# Patient Record
Sex: Male | Born: 1980 | Race: Black or African American | Hispanic: No | Marital: Single
Health system: Southern US, Community
[De-identification: ages and names within clinical notes are randomized; demographics above are authoritative.]

---

## 2006-08-29 ENCOUNTER — Emergency Department (HOSPITAL_COMMUNITY): Admission: EM | Admit: 2006-08-29 | Discharge: 2006-08-29 | Payer: Self-pay | Admitting: Emergency Medicine

## 2021-04-05 ENCOUNTER — Emergency Department: Payer: PRIVATE HEALTH INSURANCE

## 2021-04-05 ENCOUNTER — Emergency Department
Admission: EM | Admit: 2021-04-05 | Discharge: 2021-04-05 | Disposition: A | Discharge status: Other Acute Inpatient Hospital | Payer: PRIVATE HEALTH INSURANCE | Attending: Emergency Medicine | Admitting: Emergency Medicine

## 2021-04-05 DIAGNOSIS — Z20822 Contact with and (suspected) exposure to covid-19: Secondary | ICD-10-CM | POA: Insufficient documentation

## 2021-04-05 DIAGNOSIS — W1789XA Other fall from one level to another, initial encounter: Secondary | ICD-10-CM | POA: Insufficient documentation

## 2021-04-05 DIAGNOSIS — S82872B Displaced pilon fracture of left tibia, initial encounter for open fracture type I or II: Secondary | ICD-10-CM | POA: Insufficient documentation

## 2021-04-05 DIAGNOSIS — S82302A Unspecified fracture of lower end of left tibia, initial encounter for closed fracture: Secondary | ICD-10-CM

## 2021-04-05 DIAGNOSIS — Y9289 Other specified places as the place of occurrence of the external cause: Secondary | ICD-10-CM | POA: Insufficient documentation

## 2021-04-05 DIAGNOSIS — S0990XA Unspecified injury of head, initial encounter: Secondary | ICD-10-CM | POA: Insufficient documentation

## 2021-04-05 DIAGNOSIS — Y9339 Activity, other involving climbing, rappelling and jumping off: Secondary | ICD-10-CM | POA: Insufficient documentation

## 2021-04-05 DIAGNOSIS — M25572 Pain in left ankle and joints of left foot: Secondary | ICD-10-CM | POA: Insufficient documentation

## 2021-04-05 DIAGNOSIS — S99912A Unspecified injury of left ankle, initial encounter: Secondary | ICD-10-CM | POA: Diagnosis present

## 2021-04-05 LAB — CBC WITH DIFFERENTIAL/PLATELET
Abs Immature Granulocytes: 0.02 10*3/uL (ref 0.00–0.07)
Basophils Absolute: 0 10*3/uL (ref 0.0–0.1)
Basophils Relative: 0 %
Eosinophils Absolute: 0 10*3/uL (ref 0.0–0.5)
Eosinophils Relative: 0 %
HCT: 40.9 % (ref 39.0–52.0)
Hemoglobin: 14.5 g/dL (ref 13.0–17.0)
Immature Granulocytes: 0 %
Lymphocytes Relative: 25 %
Lymphs Abs: 2.6 10*3/uL (ref 0.7–4.0)
MCH: 30.9 pg (ref 26.0–34.0)
MCHC: 35.5 g/dL (ref 30.0–36.0)
MCV: 87 fL (ref 80.0–100.0)
Monocytes Absolute: 0.7 10*3/uL (ref 0.1–1.0)
Monocytes Relative: 7 %
Neutro Abs: 6.9 10*3/uL (ref 1.7–7.7)
Neutrophils Relative %: 68 %
Platelets: 269 10*3/uL (ref 150–400)
RBC: 4.7 MIL/uL (ref 4.22–5.81)
RDW: 11.6 % (ref 11.5–15.5)
WBC: 10.3 10*3/uL (ref 4.0–10.5)
nRBC: 0 % (ref 0.0–0.2)

## 2021-04-05 LAB — BASIC METABOLIC PANEL
Anion gap: 11 (ref 5–15)
BUN: 13 mg/dL (ref 6–20)
CO2: 23 mmol/L (ref 22–32)
Calcium: 9.4 mg/dL (ref 8.9–10.3)
Chloride: 103 mmol/L (ref 98–111)
Creatinine, Ser: 0.95 mg/dL (ref 0.61–1.24)
GFR, Estimated: >60 mL/min (ref >60)
Glucose, Bld: 107 mg/dL — ABNORMAL HIGH (ref 70–99)
Potassium: 3.4 mmol/L — ABNORMAL LOW (ref 3.5–5.1)
Sodium: 137 mmol/L (ref 135–145)

## 2021-04-05 LAB — RESP PANEL BY RT-PCR (FLU A&B, COVID) ARPGX2
Influenza A by PCR: NEGATIVE
Influenza B by PCR: NEGATIVE
SARS Coronavirus 2 by RT PCR: NEGATIVE

## 2021-04-05 MED ORDER — ONDANSETRON HCL 4 MG/2ML IJ SOLN
4.0000 mg | Freq: Once | INTRAMUSCULAR | Status: AC
  Administered 2021-04-05: 4 mg via INTRAVENOUS
  Filled 2021-04-05: qty 2

## 2021-04-05 MED ORDER — FENTANYL CITRATE (PF) 100 MCG/2ML IJ SOLN
50.0000 ug | INTRAMUSCULAR | Status: DC | PRN
  Administered 2021-04-05: 50 ug via INTRAMUSCULAR
  Filled 2021-04-05: qty 2

## 2021-04-05 MED ORDER — FENTANYL CITRATE (PF) 100 MCG/2ML IJ SOLN
50.0000 ug | INTRAMUSCULAR | Status: AC | PRN
  Administered 2021-04-05: 50 ug via INTRAVENOUS

## 2021-04-05 MED ORDER — CEFAZOLIN SODIUM-DEXTROSE 2-4 GM/100ML-% IV SOLN
2.0000 g | Freq: Once | INTRAVENOUS | Status: AC
  Administered 2021-04-05: 2 g via INTRAVENOUS
  Filled 2021-04-05: qty 100

## 2021-04-05 NOTE — ED Triage Notes (Addendum)
Pt presents in the custody of [facility].  C/o L ankle pain and laceration.  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  Pt reports landing directly on L ankle/foot.  Pain score 10/10.    Deformity noted above ankle.  Moderate bleeding noted.  Pressure dressing applied.

## 2021-04-05 NOTE — ED Notes (Signed)
Patient transported to CT 

## 2021-04-05 NOTE — ED Notes (Signed)
Pt BIB [employer] for fall from approximately 12 feet onto left foot. Pt a/ox4, denies head/neck or back pain or LOC. Obvious deformity to ankle. Possible open FX with blood noted. +distal functions.

## 2021-04-05 NOTE — ED Provider Notes (Addendum)
[facility] Emergency Department Provider Note ____________________________________________  Time seen: 1400  I have reviewed the triage vital signs and the nursing notes.  HISTORY  Chief Complaint  Ankle Pain and Extremity Laceration   HPI Stephen Yang is a 40 y.o. male presents to the ED urgently, and custody of [employer].  Patient was an inmate at the jail, when he apparently fell from a height of about 12 feet landing on his feet.  He was apparently climbing on an air duct, when he fell landing primarily his left foot and ankle.He presents with immediate pain, deformity, and disability to the left distal ankle.  He denies any other injury including head injury or LOC.  Scheduled to be released from jail on tomorrow.  History reviewed. No pertinent past medical history.  There are no problems to display for this patient.   History reviewed. No pertinent surgical history.  Prior to Admission medications   Not on File    Allergies Patient has no known allergies.  History reviewed. No pertinent family history.  Social History    Review of Systems  Constitutional: Negative for fever. Eyes: Negative for visual changes. ENT: Negative for sore throat. Cardiovascular: Negative for chest pain. Respiratory: Negative for shortness of breath. Gastrointestinal: Negative for abdominal pain, vomiting and diarrhea. Genitourinary: Negative for dysuria. Musculoskeletal: Negative for back pain. Left ankle deformity  Skin: Negative for rash. Neurological: Negative for headaches, focal weakness or numbness. ____________________________________________  PHYSICAL EXAM:  VITAL SIGNS: ED Triage Vitals  Enc Vitals Group     BP 04/05/21 1340 140/86     Pulse Rate 04/05/21 1340 75     Resp 04/05/21 1340 (!) 22     Temp 04/05/21 1340 98.6 F (37 C)     Temp Source 04/05/21 1340 Oral     SpO2 04/05/21 1340 100 %     Weight 04/05/21 1341  165 lb (74.8 kg)     Height 04/05/21 1341 5\' 10"  (1.778 m)     Head Circumference --      Peak Flow --      Pain Score 04/05/21 1341 10     Pain Loc --      Pain Edu? --      Excl. in GC? --     Constitutional: Alert and oriented. Well appearing and in no distress. GCS = 15 Head: Normocephalic and atraumatic. Eyes: Conjunctivae are normal. Normal extraocular movements Mouth/Throat: Mucous membranes are moist. Neck: Supple. Normal ROM. No crepitus of midline tenderness Hematological/Lymphatic/Immunological: No cervical lymphadenopathy. Cardiovascular: Normal rate, regular rhythm. Normal distal pulses and cap refill. Respiratory: Normal respiratory effort. No wheezes/rales/rhonchi. Gastrointestinal: Soft and nontender. No distention. Musculoskeletal: Normal spinal alignment without midline tenderness, spasm, vomiting, or step-off.  Left ankle with obvious deformity appreciated.  Patient with a 3 mm lateral malleoli skin puncture wound with active bleeding noted.  Nontender with normal range of motion in all other extremities.  Neurologic:  Normal gross sensation. Normal speech and language. No gross focal neurologic deficits are appreciated. Skin:  Skin is warm, dry and intact. No rash noted. Psychiatric: Mood and affect are normal. Patient exhibits appropriate insight and judgment. ____________________________________________    {LABS (pertinent positives/negatives) Labs Reviewed  BASIC METABOLIC PANEL - Abnormal; Notable for the following components:      Result Value   Potassium 3.4 (*)    Glucose, Bld 107 (*)    All other components within normal limits  RESP PANEL BY RT-PCR (FLU A&B,  COVID) ARPGX2  CBC WITH DIFFERENTIAL/PLATELET  ____________________________________________  {EKG  ____________________________________________   RADIOLOGY Official radiology report(s): DG Tibia/Fibula Left  Result Date: 04/05/2021 CLINICAL DATA:  Distal tib fib fracture.  Status post  reduction. EXAM: LEFT TIBIA AND FIBULA - 2 VIEW COMPARISON:  April 05, 2021 FINDINGS: The patient has been placed in a cast in the interval. The displaced comminuted distal tibial fracture has been partially reduced. The distal fibular fracture has been partially reduced. No other acute abnormalities. IMPRESSION: The patient has been placed in a cast. The displaced comminuted distal tibial fracture has been partially reduced as has the displaced distal fibular fracture. Malalignment remains however. Electronically Signed   By: Gerome Sam III M.D.   On: 04/05/2021 15:58   DG Ankle Complete Left  Result Date: 04/05/2021 CLINICAL DATA:  Fall, pain. EXAM: LEFT ANKLE COMPLETE - 3+ VIEW COMPARISON:  None. FINDINGS: Markedly displaced/comminuted fractures of the distal tibia and fibula, with extension to the articular surface of the distal tibia, involvement of the medial malleolus and probable involvement of the posterior malleolus. Proximal fibular fracture fragment approximates the skin surface but there is no obvious evidence of open fracture. Associated asymmetry of the ankle mortise. Talar dome appears grossly intact. Visualized osseous structures of the hindfoot appear grossly intact and normally aligned. IMPRESSION: 1. Markedly displaced/comminuted fractures of the distal tibia and fibula, likely trimalleolar, and with fracture extension to the articular surface of the distal tibia. 2. No convincing evidence of open fracture, although the proximal fibular fracture fragment approximates the skin surface and overlying bandages. Electronically Signed   By: Bary Richard M.D.   On: 04/05/2021 14:41   CT HEAD WO CONTRAST ( )  Result Date: 04/05/2021 CLINICAL DATA:  Polytrauma, critical, head/C-spine injury suspected. Fall from 12' height. EXAM: CT HEAD WITHOUT CONTRAST CT CERVICAL SPINE WITHOUT CONTRAST TECHNIQUE: Multidetector CT imaging of the head and cervical spine was performed following the  standard protocol without intravenous contrast. Multiplanar CT image reconstructions of the cervical spine were also generated. COMPARISON:  None. FINDINGS: CT HEAD FINDINGS Brain: There is no evidence of an acute infarct, intracranial hemorrhage, mass, midline shift, or extra-axial fluid collection. The ventricles and sulci are normal. Vascular: No hyperdense vessel. Skull: No fracture or suspicious osseous lesion. Sinuses/Orbits: Visualized paranasal sinuses and mastoid air cells are clear. Unremarkable orbits. Other: None. CT CERVICAL SPINE FINDINGS Alignment: Straightening of the normal cervical lordosis. Trace retrolisthesis of C5 on C6, degenerative in appearance. Skull base and vertebrae: No acute fracture or suspicious osseous lesion. Soft tissues and spinal canal: No prevertebral fluid or swelling. No visible canal hematoma. Disc levels: Focally advanced disc degeneration at C5-6 with moderate to severe disc space narrowing and prominent degenerative endplate sclerosis. A broad-based posterior disc osteophyte complex at C5-6 including asymmetric right uncovertebral spurring results in suspected moderate spinal stenosis and moderate right and mild left neural foraminal stenosis. Upper chest: Clear lung apices. Other: None. IMPRESSION: 1. Negative head CT. 2. No acute cervical spine fracture. 3. Focally advanced disc degeneration at C5-6 with suspected moderate spinal stenosis and moderate right neural foraminal stenosis. Electronically Signed   By: Sebastian Ache M.D.   On: 04/05/2021 16:02   CT Cervical Spine Wo Contrast  Result Date: 04/05/2021 CLINICAL DATA:  Polytrauma, critical, head/C-spine injury suspected. Fall from 12' height. EXAM: CT HEAD WITHOUT CONTRAST CT CERVICAL SPINE WITHOUT CONTRAST TECHNIQUE: Multidetector CT imaging of the head and cervical spine was performed following the standard protocol without intravenous contrast. Multiplanar  CT image reconstructions of the cervical spine were  also generated. COMPARISON:  None. FINDINGS: CT HEAD FINDINGS Brain: There is no evidence of an acute infarct, intracranial hemorrhage, mass, midline shift, or extra-axial fluid collection. The ventricles and sulci are normal. Vascular: No hyperdense vessel. Skull: No fracture or suspicious osseous lesion. Sinuses/Orbits: Visualized paranasal sinuses and mastoid air cells are clear. Unremarkable orbits. Other: None. CT CERVICAL SPINE FINDINGS Alignment: Straightening of the normal cervical lordosis. Trace retrolisthesis of C5 on C6, degenerative in appearance. Skull base and vertebrae: No acute fracture or suspicious osseous lesion. Soft tissues and spinal canal: No prevertebral fluid or swelling. No visible canal hematoma. Disc levels: Focally advanced disc degeneration at C5-6 with moderate to severe disc space narrowing and prominent degenerative endplate sclerosis. A broad-based posterior disc osteophyte complex at C5-6 including asymmetric right uncovertebral spurring results in suspected moderate spinal stenosis and moderate right and mild left neural foraminal stenosis. Upper chest: Clear lung apices. Other: None. IMPRESSION: 1. Negative head CT. 2. No acute cervical spine fracture. 3. Focally advanced disc degeneration at C5-6 with suspected moderate spinal stenosis and moderate right neural foraminal stenosis. Electronically Signed   By: Sebastian AcheAllen  Grady M.D.   On: 04/05/2021 16:02   CT Lumbar Spine Wo Contrast  Result Date: 04/05/2021 CLINICAL DATA:  Low back pain, increased fracture risk fall from 12' height. EXAM: CT LUMBAR SPINE WITHOUT CONTRAST TECHNIQUE: Multidetector CT imaging of the lumbar spine was performed without intravenous contrast administration. Multiplanar CT image reconstructions were also generated. COMPARISON:  None. FINDINGS: Segmentation: 5 lumbar type vertebrae. Alignment: Normal. Vertebrae: No acute fracture or suspicious osseous lesion. Paraspinal and other soft tissues:  Unremarkable. Disc levels: Intervertebral disc space heights are preserved. At L5-S1, there is disc bulging with a broad central disc protrusion and endplate spurring resulting in mild-to-moderate right greater than left neural foraminal stenosis without spinal stenosis. IMPRESSION: No acute osseous abnormality. Electronically Signed   By: Sebastian AcheAllen  Grady M.D.   On: 04/05/2021 15:52   ____________________________________________  PROCEDURES  Fentanyl 50 mcg x 1 IM Fentanyl 50 mcg x 1 IVP Zofran 4 mg IVP Cefazolin 1 g IVPB  .Splint Application  Date/Time: 04/05/2021 3:22 PM Performed by: Lissa HoardMenshew, Camari Wisham V Bacon, PA-C Authorized by: Lissa HoardMenshew, Talene Glastetter V Bacon, PA-C   Consent:    Consent obtained:  Verbal   Consent given by:  Patient   Risks, benefits, and alternatives were discussed: yes     Risks discussed:  Pain Universal protocol:    Procedure explained and questions answered to patient or proxy's satisfaction: yes     Test results available: yes     Imaging studies available: yes     Site/side marked: yes     Patient identity confirmed:  Verbally with patient Pre-procedure details:    Distal neurologic exam:  Normal   Distal perfusion: distal pulses strong   Procedure details:    Location:  Ankle   Ankle location:  L ankle   Strapping: no     Splint type:  Short leg   Supplies:  Cotton padding, elastic bandage and fiberglass   Attestation: Splint applied and adjusted personally by me   Post-procedure details:    Distal neurologic exam:  Normal   Distal perfusion: distal pulses strong     Procedure completion:  Tolerated well, no immediate complications   Post-procedure imaging: reviewed   Reduction of fracture  Date/Time: 04/05/2021 3:27 PM Performed by: Lissa HoardMenshew, Charonda Hefter V Bacon, PA-C Authorized by: Lissa HoardMenshew, Tameron Lama V Bacon,  PA-C  Consent: Verbal consent obtained. Written consent not obtained. Risks and benefits: risks, benefits and alternatives were discussed Consent given  by: patient Patient understanding: patient states understanding of the procedure being performed Patient consent: the patient's understanding of the procedure matches consent given Procedure consent: procedure consent matches procedure scheduled Test results: test results available and properly labeled Site marked: the operative site was marked Imaging studies: imaging studies available Patient identity confirmed: verbally with patient Local anesthesia used: no  Anesthesia: Local anesthesia used: no  Sedation: Patient sedated: no  Patient tolerance: patient tolerated the procedure well with no immediate complications Comments: Interim reduction of fracture fragments during splint application as evidenced on the post reduction/post splinting tib-fib films.   ____________________________________________   INITIAL IMPRESSION / ASSESSMENT AND PLAN / ED COURSE  As part of my medical decision making, I reviewed the following data within the electronic MEDICAL RECORD NUMBER Labs reviewed WNL, Radiograph reviewed as noted, evaluated by my attending M. Fuller Plan, MD, Discussed with admitting physician [facility], A consult was requested and obtained from this/these consultant(s) Orthopedics, Notes from prior ED visits, and [place] Controlled Substance Database   ----------------------------------------- 2:29 PM on 04/05/2021 ----------------------------------------- S/w Dr. Robbie Lis: he suggested referral to [facility] due to the compound pilon fracture.    CRITICAL CARE Performed by: Lissa Hoard   Total critical care time: 20 minutes  Critical care time was exclusive of separately billable procedures and treating other patients.  Critical care was necessary to treat or prevent imminent or life-threatening deterioration.  Critical care was time spent personally by me on the following activities: development of treatment plan with patient and/or surrogate as well as nursing,  discussions with consultants, evaluation of patient's response to treatment, examination of patient, obtaining history from patient or surrogate, ordering and performing treatments and interventions, ordering and review of laboratory studies, ordering and review of radiographic studies, pulse oximetry and re-evaluation of patient's condition.  ----------------------------------------- 4:18 PM on 04/05/2021 ----------------------------------------- Spoke with Dr. Debbe Bales, he will accept the patient via ED to ED transfer.  I spoke to Dr. Cleophas Dunker the ED attending, and he excepted the patient as planned.  Transport will be arranged and the patient is agreeable at this time to transfer of care.  Carlyn Stofko was evaluated in Emergency Department on 04/05/2021 for the symptoms described in the history of present illness. He was evaluated in the context of the global COVID-19 pandemic, which necessitated consideration that the patient might be at risk for infection with the SARS-CoV-2 virus that causes COVID-19. Institutional protocols and algorithms that pertain to the evaluation of patients at risk for COVID-19 are in a state of rapid change based on information released by regulatory bodies including the CDC and federal and state organizations. These policies and algorithms were followed during the patient's care in the ED. ____________________________________________  FINAL CLINICAL IMPRESSION(S) / ED DIAGNOSES  Final diagnoses:  Pilon fracture of left tibia, open type I or II, initial encounter  Traumatic closed displaced fracture of distal end of left tibia with fibula, initial encounter      Lissa Hoard, PA-C 04/05/21 1618    Carolan Avedisian, Charlesetta Ivory, PA-C 04/05/21 1632    Concha Se, MD 04/06/21 1329

## 2021-04-05 NOTE — ED Notes (Signed)
Report given to Reuel Boom, Consulting civil engineer at [facility] ER

## 2022-06-21 IMAGING — DX DG ANKLE COMPLETE 3+V*L*
2 series · 2 of 2 positions shown · non-contrast
Comparison: None.

CLINICAL DATA: Fall, pain.

EXAM:
LEFT ANKLE COMPLETE - 3+ VIEW

[ankle ap]
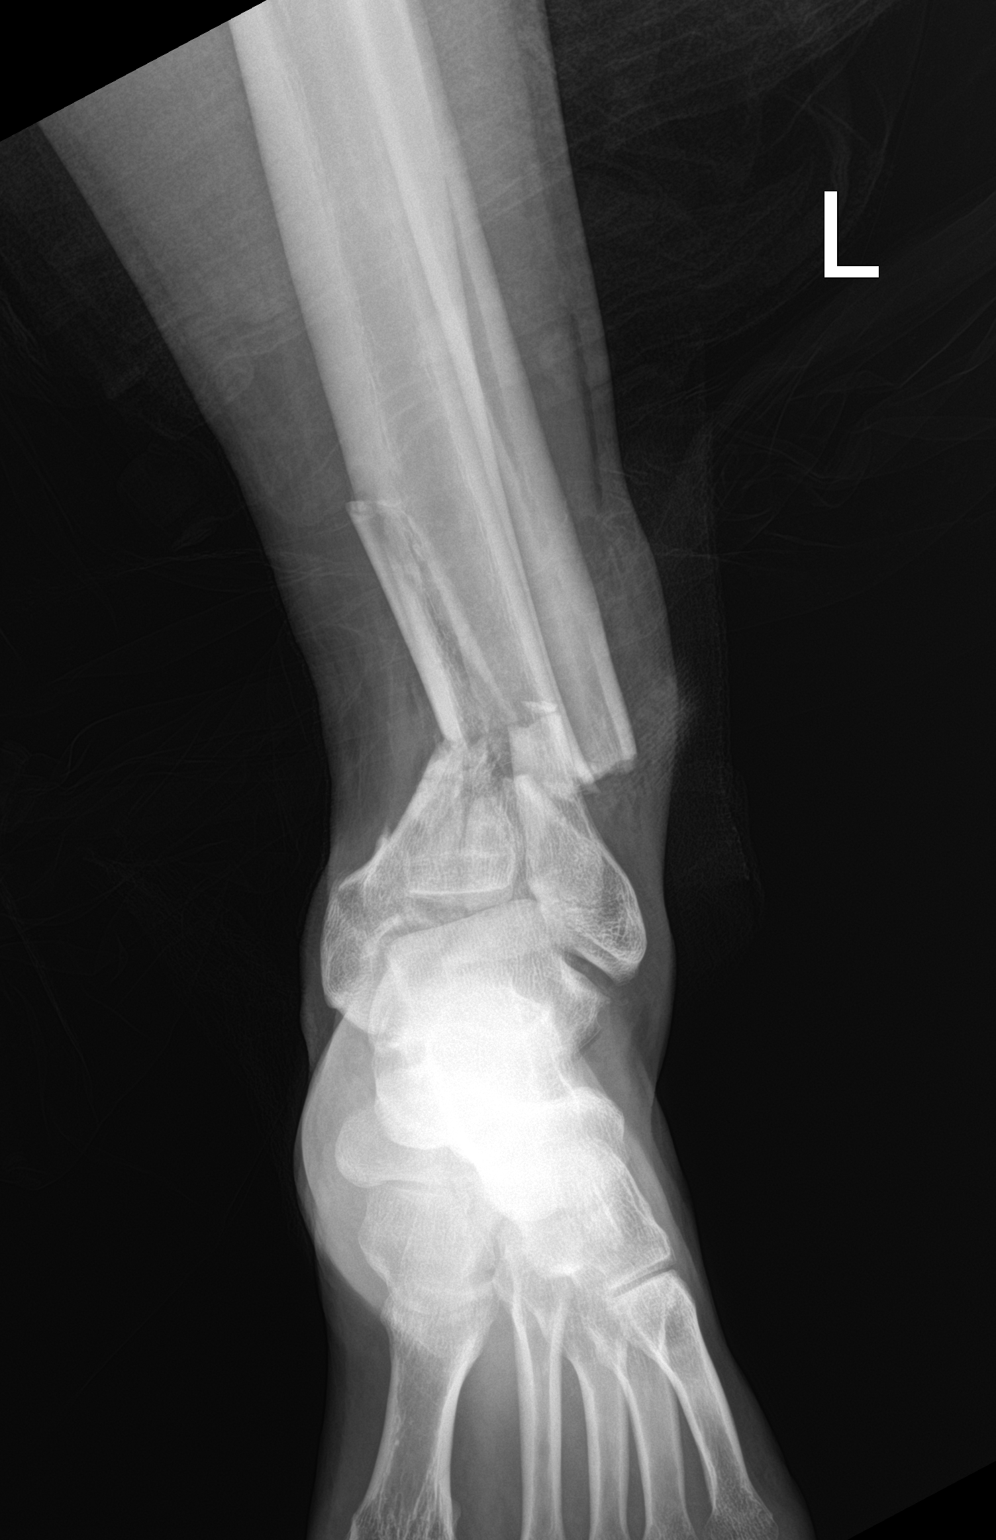

[ankle obl]
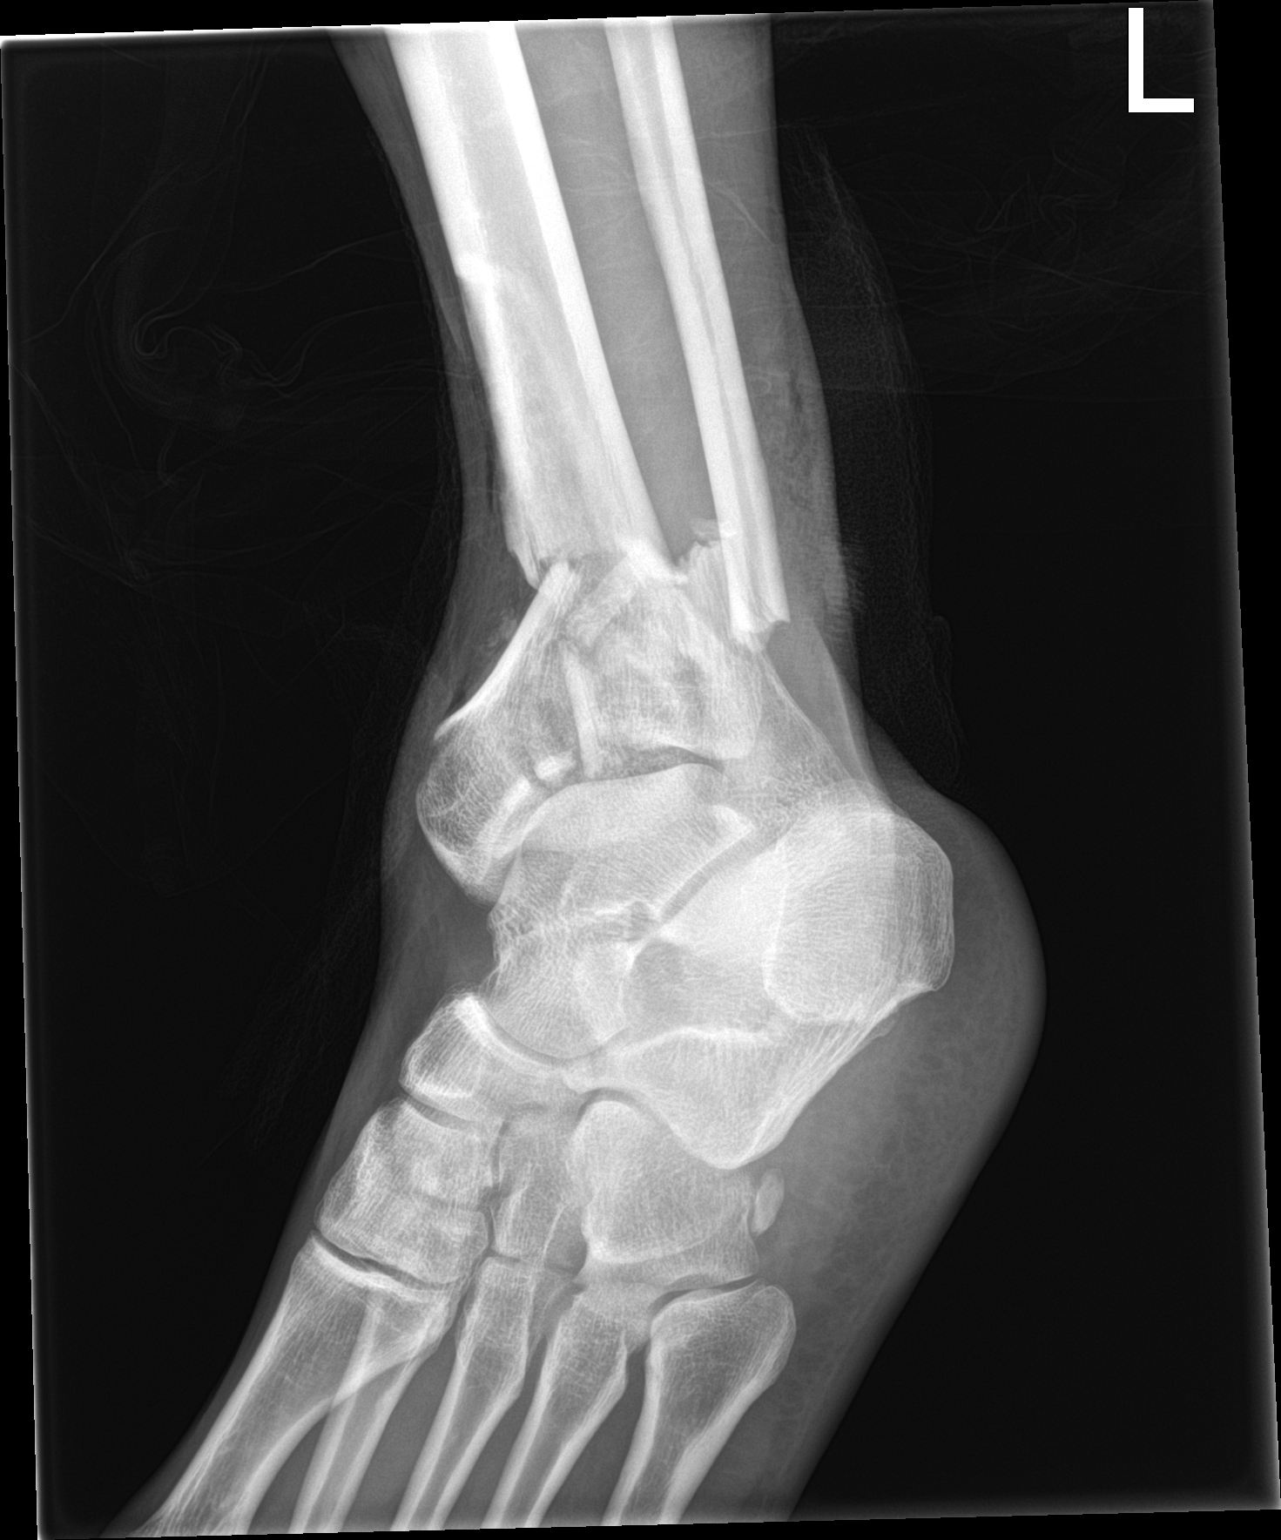

[2 of 2 positions shown; findings below may reference images not displayed]

FINDINGS: Markedly displaced/comminuted fractures of the distal tibia and
fibula, with extension to the articular surface of the distal tibia,
involvement of the medial malleolus and probable involvement of the
posterior malleolus.

Proximal fibular fracture fragment approximates the skin surface but
there is no obvious evidence of open fracture. Associated asymmetry
of the ankle mortise. Talar dome appears grossly intact. Visualized
osseous structures of the hindfoot appear grossly intact and
normally aligned.
IMPRESSION: 1. Markedly displaced/comminuted fractures of the distal tibia and
fibula, likely trimalleolar, and with fracture extension to the
articular surface of the distal tibia.
2. No convincing evidence of open fracture, although the proximal
fibular fracture fragment approximates the skin surface and
overlying bandages.

## 2022-06-21 IMAGING — CT CT CERVICAL SPINE W/O CM
3 of 4 series · 12 of 33 positions shown, 14 images · non-contrast
Comparison: None.

CLINICAL DATA: Polytrauma, critical, head/C-spine injury suspected.
Fall from 12' height.

EXAM:
CT HEAD WITHOUT CONTRAST
CT CERVICAL SPINE WITHOUT CONTRAST
TECHNIQUE: Multidetector CT imaging of the head and cervical spine was
performed following the standard protocol without intravenous
contrast. Multiplanar CT image reconstructions of the cervical spine
were also generated.

[Series 6: orthogonal bone · axial · 0.23mm/px · z∈[-277,-162]mm · 4 of 93 slices shown, 5 images]
[im 16/93  soft-tissue]
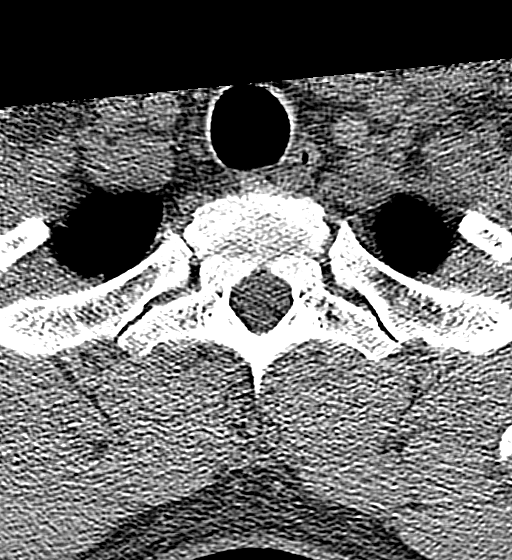
[im 16/93  bone]
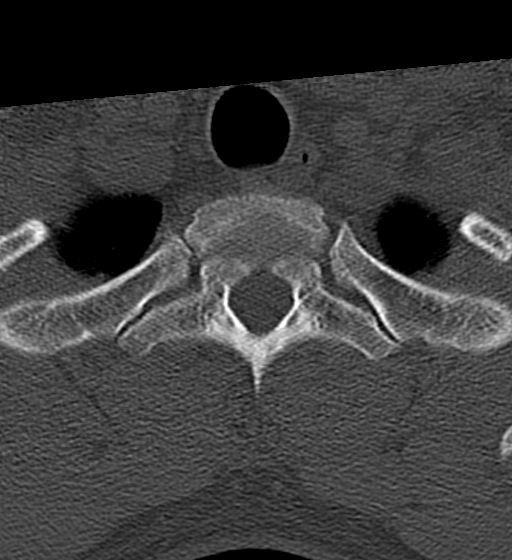
[im 31/93  bone]
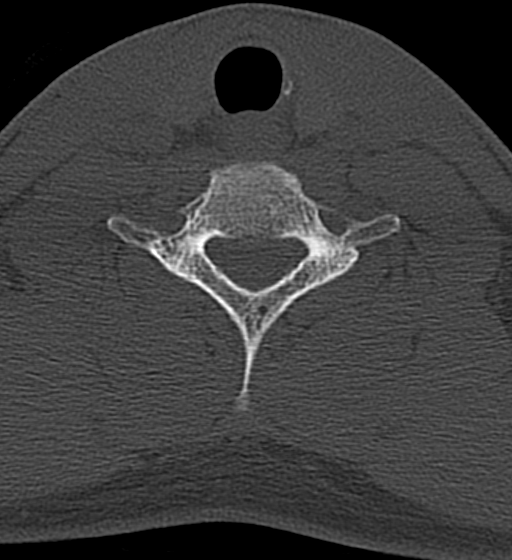
[im 62/93  bone]
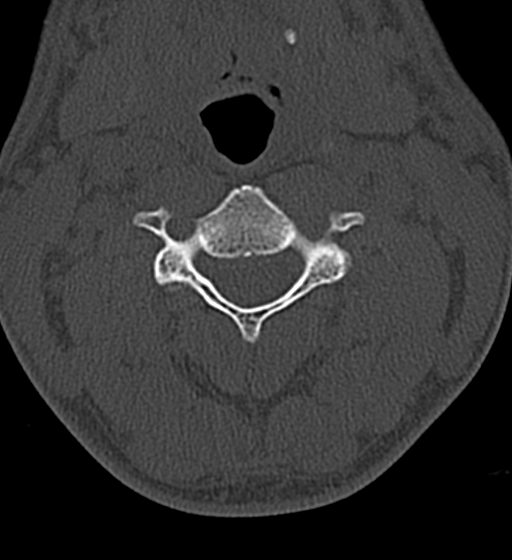
[im 77/93  bone]
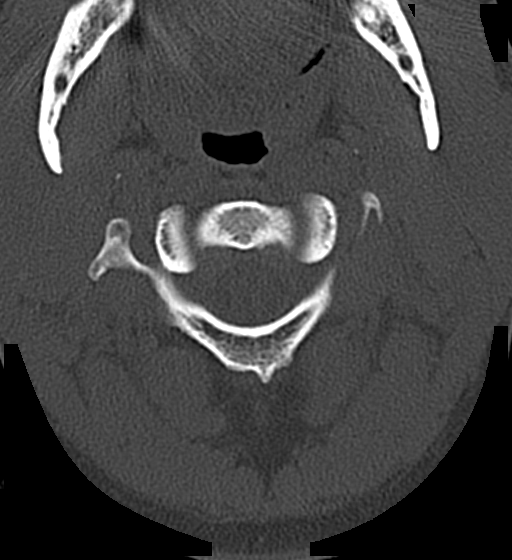

[Series 7: sagittal bone · sagittal · 0.27mm/px · 5 of 62 slices shown, 6 images]
[im 21/62  bone]
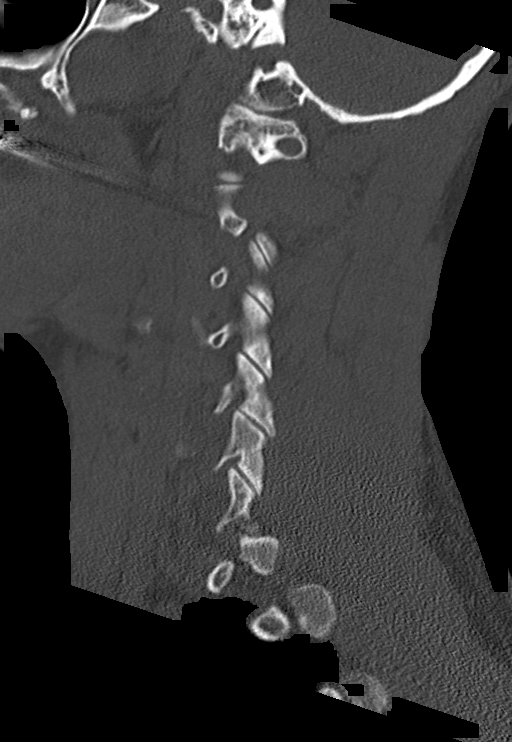
[im 26/62  bone]
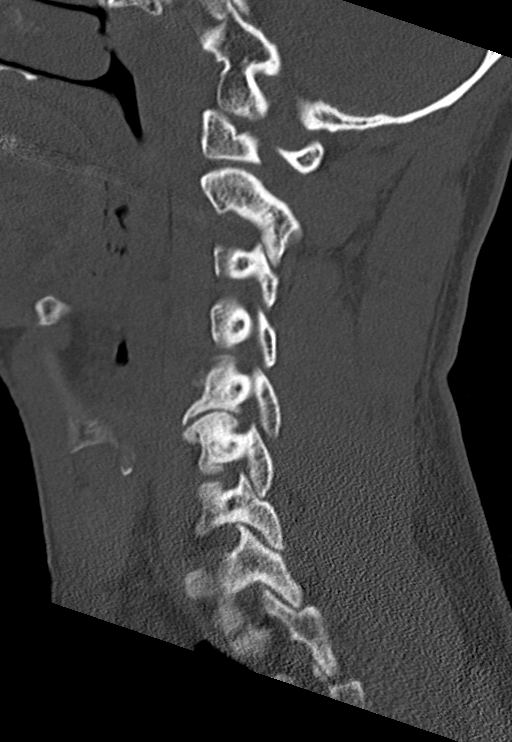
[im 31/62  soft-tissue]
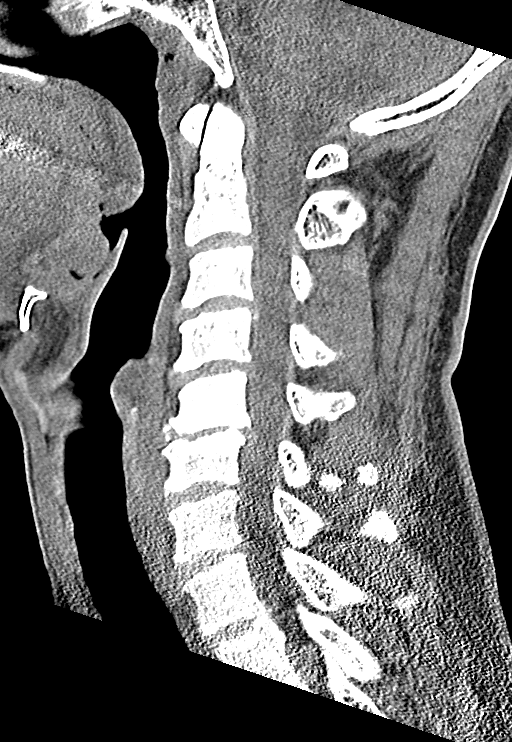
[im 31/62  bone]
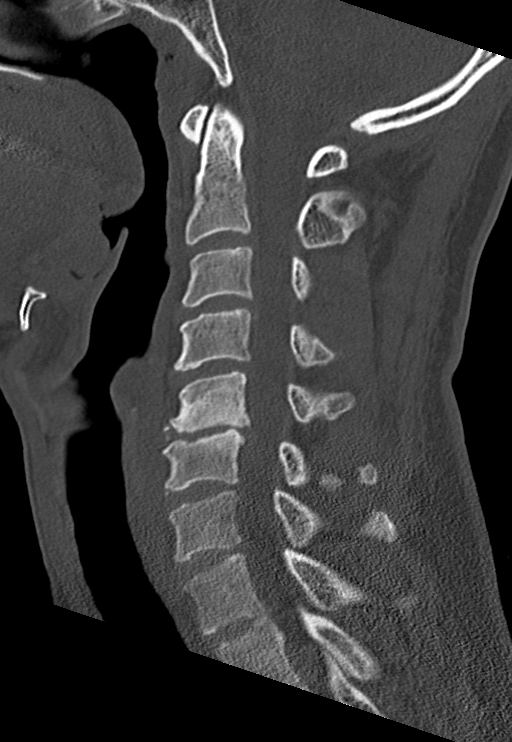
[im 36/62  bone]
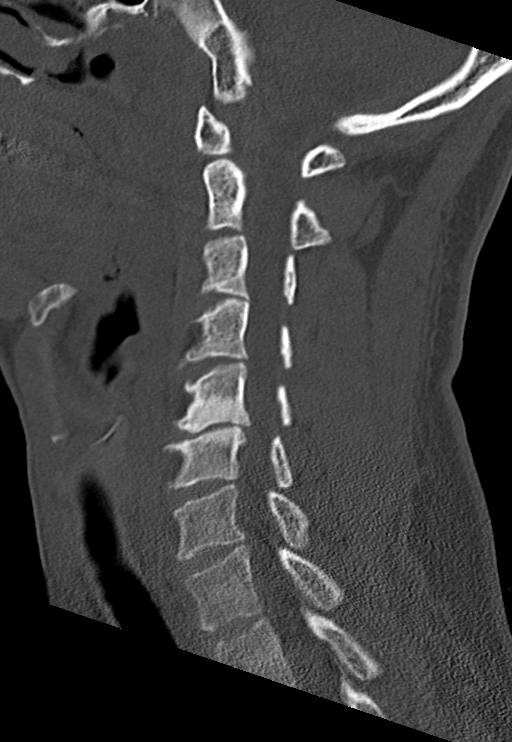
[im 41/62  bone]
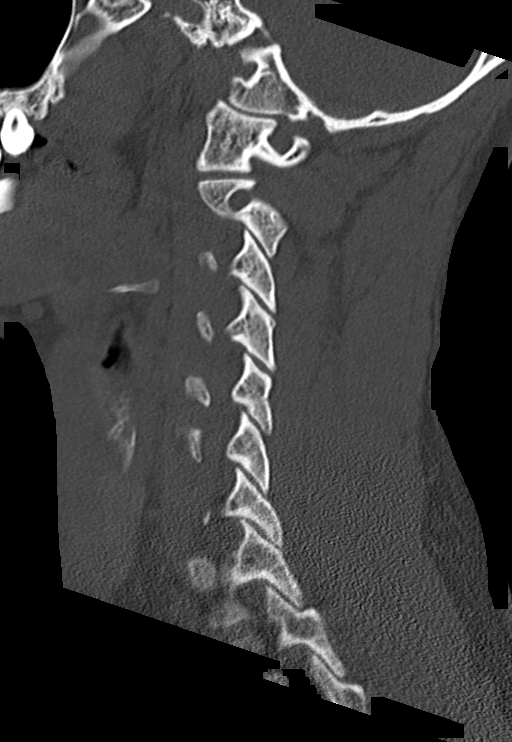

[Series 8: coronal bone · coronal · 0.26mm/px · 3 of 67 slices shown]
[im 14/67  bone]
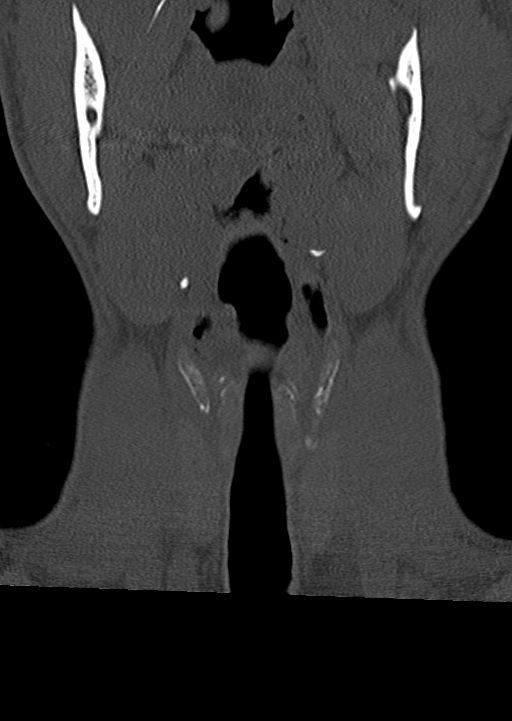
[im 27/67  bone]
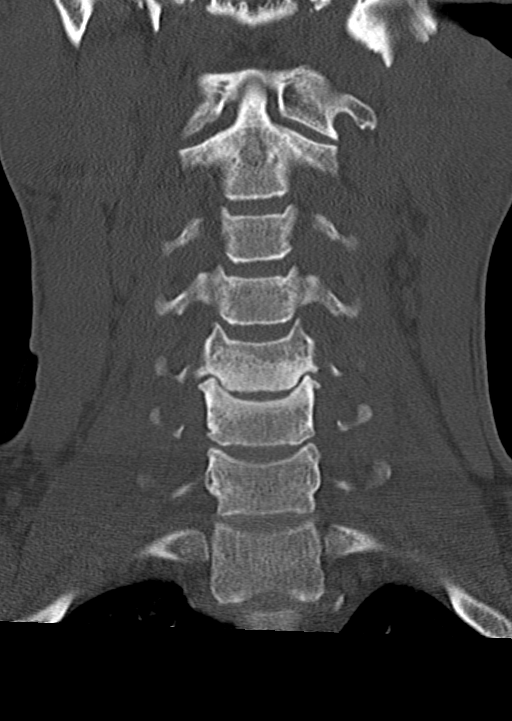
[im 40/67  bone]
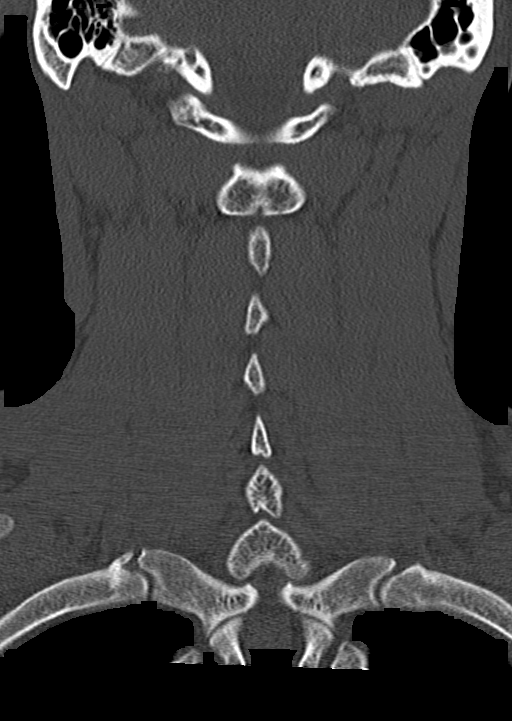

[12 of 33 positions shown; findings below may reference images not displayed]

FINDINGS: CT HEAD FINDINGS

Brain: There is no evidence of an acute infarct, intracranial
hemorrhage, mass, midline shift, or extra-axial fluid collection.
The ventricles and sulci are normal.

Vascular: No hyperdense vessel.

Skull: No fracture or suspicious osseous lesion.

Sinuses/Orbits: Visualized paranasal sinuses and mastoid air cells
are clear. Unremarkable orbits.

Other: None.

CT CERVICAL SPINE FINDINGS

Alignment: Straightening of the normal cervical lordosis. Trace
retrolisthesis of C5 on C6, degenerative in appearance.

Skull base and vertebrae: No acute fracture or suspicious osseous
lesion.

Soft tissues and spinal canal: No prevertebral fluid or swelling. No
visible canal hematoma.

Disc levels: Focally advanced disc degeneration at C5-6 with
moderate to severe disc space narrowing and prominent degenerative
endplate sclerosis. A broad-based posterior disc osteophyte complex
at C5-6 including asymmetric right uncovertebral spurring results in
suspected moderate spinal stenosis and moderate right and mild left
neural foraminal stenosis.

Upper chest: Clear lung apices.

Other: None.
IMPRESSION: 1. Negative head CT.
2. No acute cervical spine fracture.
3. Focally advanced disc degeneration at C5-6 with suspected
moderate spinal stenosis and moderate right neural foraminal
stenosis.
